# Patient Record
Sex: Male | Born: 1980 | Race: White | Hispanic: No | Marital: Single | Smoking: Current some day smoker
Health system: Southern US, Community
[De-identification: ages and names within clinical notes are randomized; demographics above are authoritative.]

## PROBLEM LIST (undated history)

## (undated) DIAGNOSIS — E291 Testicular hypofunction: Secondary | ICD-10-CM

## (undated) DIAGNOSIS — B009 Herpesviral infection, unspecified: Secondary | ICD-10-CM

## (undated) DIAGNOSIS — J36 Peritonsillar abscess: Secondary | ICD-10-CM

## (undated) DIAGNOSIS — E559 Vitamin D deficiency, unspecified: Secondary | ICD-10-CM

## (undated) DIAGNOSIS — F419 Anxiety disorder, unspecified: Secondary | ICD-10-CM

## (undated) HISTORY — DX: Herpesviral infection, unspecified: B00.9

## (undated) HISTORY — DX: Vitamin D deficiency, unspecified: E55.9

## (undated) HISTORY — DX: Peritonsillar abscess: J36

## (undated) HISTORY — DX: Testicular hypofunction: E29.1

## (undated) HISTORY — DX: Anxiety disorder, unspecified: F41.9

---

## 2010-05-17 ENCOUNTER — Encounter: Admission: RE | Admit: 2010-05-17 | Discharge: 2010-05-17 | Payer: Self-pay | Admitting: Internal Medicine

## 2010-07-15 ENCOUNTER — Emergency Department (HOSPITAL_COMMUNITY): Admission: EM | Admit: 2010-07-15 | Discharge: 2010-07-15 | Payer: Self-pay | Admitting: Emergency Medicine

## 2010-09-10 ENCOUNTER — Emergency Department (HOSPITAL_COMMUNITY)
Admission: EM | Admit: 2010-09-10 | Discharge: 2010-09-10 | Payer: Self-pay | Source: Home / Self Care | Admitting: Emergency Medicine
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
## 2012-08-29 DIAGNOSIS — B009 Herpesviral infection, unspecified: Secondary | ICD-10-CM

## 2012-08-29 HISTORY — DX: Herpesviral infection, unspecified: B00.9

## 2013-08-13 ENCOUNTER — Encounter: Payer: Self-pay | Admitting: Physician Assistant

## 2013-08-15 ENCOUNTER — Ambulatory Visit: Payer: Self-pay | Admitting: Physician Assistant

## 2013-09-10 ENCOUNTER — Encounter: Payer: Self-pay | Admitting: Physician Assistant

## 2013-09-10 ENCOUNTER — Ambulatory Visit (INDEPENDENT_AMBULATORY_CARE_PROVIDER_SITE_OTHER): Payer: BC Managed Care – PPO | Admitting: Physician Assistant

## 2013-09-10 VITALS — BP 132/80 | HR 88 | Temp 98.4°F | Resp 16 | Ht 71.0 in | Wt 187.0 lb

## 2013-09-10 DIAGNOSIS — Z113 Encounter for screening for infections with a predominantly sexual mode of transmission: Secondary | ICD-10-CM

## 2013-09-10 DIAGNOSIS — R300 Dysuria: Secondary | ICD-10-CM

## 2013-09-10 DIAGNOSIS — E0590 Thyrotoxicosis, unspecified without thyrotoxic crisis or storm: Secondary | ICD-10-CM

## 2013-09-10 DIAGNOSIS — I10 Essential (primary) hypertension: Secondary | ICD-10-CM

## 2013-09-10 DIAGNOSIS — F17200 Nicotine dependence, unspecified, uncomplicated: Secondary | ICD-10-CM

## 2013-09-10 DIAGNOSIS — R7402 Elevation of levels of lactic acid dehydrogenase (LDH): Secondary | ICD-10-CM

## 2013-09-10 DIAGNOSIS — R740 Nonspecific elevation of levels of transaminase and lactic acid dehydrogenase [LDH]: Secondary | ICD-10-CM

## 2013-09-10 DIAGNOSIS — N3000 Acute cystitis without hematuria: Secondary | ICD-10-CM

## 2013-09-10 LAB — BASIC METABOLIC PANEL WITH GFR
BUN: 12 mg/dL (ref 6–23)
CALCIUM: 9.5 mg/dL (ref 8.4–10.5)
CO2: 29 meq/L (ref 19–32)
Chloride: 103 mEq/L (ref 96–112)
Creat: 0.7 mg/dL (ref 0.50–1.35)
GFR, Est African American: >89 mL/min
GFR, Est Non African American: >89 mL/min
GLUCOSE: 104 mg/dL — AB (ref 70–99)
Potassium: 4.3 mEq/L (ref 3.5–5.3)
SODIUM: 138 meq/L (ref 135–145)

## 2013-09-10 LAB — CBC WITH DIFFERENTIAL/PLATELET
BASOS PCT: 0 % (ref 0–1)
Basophils Absolute: 0 10*3/uL (ref 0.0–0.1)
EOS ABS: 0 10*3/uL (ref 0.0–0.7)
Eosinophils Relative: 0 % (ref 0–5)
HEMATOCRIT: 43.4 % (ref 39.0–52.0)
HEMOGLOBIN: 14.9 g/dL (ref 13.0–17.0)
LYMPHS ABS: 1.4 10*3/uL (ref 0.7–4.0)
Lymphocytes Relative: 23 % (ref 12–46)
MCH: 29.5 pg (ref 26.0–34.0)
MCHC: 34.3 g/dL (ref 30.0–36.0)
MCV: 85.9 fL (ref 78.0–100.0)
MONO ABS: 0.4 10*3/uL (ref 0.1–1.0)
MONOS PCT: 6 % (ref 3–12)
NEUTROS PCT: 71 % (ref 43–77)
Neutro Abs: 4.5 10*3/uL (ref 1.7–7.7)
Platelets: 279 10*3/uL (ref 150–400)
RBC: 5.05 MIL/uL (ref 4.22–5.81)
RDW: 13.2 % (ref 11.5–15.5)
WBC: 6.3 10*3/uL (ref 4.0–10.5)

## 2013-09-10 LAB — HEPATIC FUNCTION PANEL
ALT: 18 U/L (ref 0–53)
AST: 15 U/L (ref 0–37)
Albumin: 4.6 g/dL (ref 3.5–5.2)
Alkaline Phosphatase: 52 U/L (ref 39–117)
BILIRUBIN DIRECT: 0.1 mg/dL (ref 0.0–0.3)
BILIRUBIN INDIRECT: 0.3 mg/dL (ref 0.0–0.9)
TOTAL PROTEIN: 7.3 g/dL (ref 6.0–8.3)
Total Bilirubin: 0.4 mg/dL (ref 0.3–1.2)

## 2013-09-10 MED ORDER — CIPROFLOXACIN HCL 500 MG PO TABS: 500.0000 mg | ORAL_TABLET | Freq: Two times a day (BID) | ORAL | Status: AC

## 2013-09-10 NOTE — Patient Instructions (Signed)
Genital Herpes °Genital herpes is a sexually transmitted disease. This means that it is a disease passed by having sex with an infected person. There is no cure for genital herpes. The time between attacks can be months to years. The virus may live in a person but produce no problems (symptoms). This infection can be passed to a baby as it travels down the birth canal (vagina). In a newborn, this can cause central nervous system damage, eye damage, or even death. The virus that causes genital herpes is usually HSV-2 virus. The virus that causes oral herpes is usually HSV-1. The diagnosis (learning what is wrong) is made through culture results. °SYMPTOMS  °Usually symptoms of pain and itching begin a few days to a week after contact. It first appears as small blisters that progress to small painful ulcers which then scab over and heal after several days. It affects the outer genitalia, birth canal, cervix, penis, anal area, buttocks, and thighs. °HOME CARE INSTRUCTIONS  °· Keep ulcerated areas dry and clean. °· Take medications as directed. Antiviral medications can speed up healing. They will not prevent recurrences or cure this infection. These medications can also be taken for suppression if there are frequent recurrences. °· While the infection is active, it is contagious. Avoid all sexual contact during active infections. °· Condoms may help prevent spread of the herpes virus. °· Practice safe sex. °· Wash your hands thoroughly after touching the genital area. °· Avoid touching your eyes after touching your genital area. °· Inform your caregiver if you have had genital herpes and become pregnant. It is your responsibility to insure a safe outcome for your baby in this pregnancy. °· Only take over-the-counter or prescription medicines for pain, discomfort, or fever as directed by your caregiver. °SEEK MEDICAL CARE IF:  °· You have a recurrence of this infection. °· You do not respond to medications and are not  improving. °· You have new sources of pain or discharge which have changed from the original infection. °· You have an oral temperature above 102° F (38.9° C). °· You develop abdominal pain. °· You develop eye pain or signs of eye infection. °Document Released: 08/12/2000 Document Revised: 11/07/2011 Document Reviewed: 09/02/2009 °[employer]® Patient Information ©2014 [employer]. °Safe Sex °Safe sex is about reducing the risk of giving or getting a sexually transmitted disease (STD). STDs are spread through sexual contact involving the genitals, mouth, or rectum. Some STDS can be cured and others cannot. Safe sex can also prevent unintended pregnancies.  °SAFE SEX PRACTICES °· Limit your sexual activity to only one partner who is only having sex with you. °· Talk to your partner about their past partners, past STDs, and drug use. °· Use a condom every time you have sexual intercourse. This includes vaginal, oral, and anal sexual activity. Both females and males should wear condoms during oral sex. Only use latex or polyurethane condoms and water-based lubricants. Petroleum-based lubricants or oils used to lubricate a condom will weaken the condom and increase the chance that it will break. The condom should be in place from the beginning to the end of sexual activity. Wearing a condom reduces, but does not completely eliminate, your risk of getting or giving a STD. STDs can be spread by contact with skin of surrounding areas. °· Get vaccinated for hepatitis B and HPV. °· Avoid alcohol and recreational drugs which can affect your judgement. You may forget to use a condom or participate in high-risk sex. °· For females,   avoid douching after sexual intercourse. Douching can spread an infection farther into the reproductive tract. °· Check your body for signs of sores, blisters, rashes, or unusual discharge. See your caregiver if you notice any of these signs. °· Avoid sexual contact if you have symptoms of an  infection or are being treated for an STD. If you or your partner has herpes, avoid sexual contact when blisters are present. Use condoms at all other times. °· See your caregiver for regular screenings, examinations, and tests for STDs. Before having sex with a new partner, each of you should be screened for STDs and talk about the results with your partner. °BENEFITS OF SAFE SEX  °· There is less of a chance of getting or giving an STD. °· You can prevent unwanted or unintended pregnancies. °· By discussing safer sex concerns with your partner, you may increase feelings of intimacy, comfort, trust, and honesty between the both of you. °Document Released: 09/22/2004 Document Revised: 05/09/2012 Document Reviewed: 02/06/2012 °[employer]® Patient Information ©2014 [employer]. ° °
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
## 2013-09-11 LAB — URINALYSIS, ROUTINE W REFLEX MICROSCOPIC
Bilirubin Urine: NEGATIVE
GLUCOSE, UA: NEGATIVE mg/dL
HGB URINE DIPSTICK: NEGATIVE
KETONES UR: NEGATIVE mg/dL
Leukocytes, UA: NEGATIVE
Nitrite: NEGATIVE
PH: 8 (ref 5.0–8.0)
PROTEIN: NEGATIVE mg/dL
Specific Gravity, Urine: 1.008 (ref 1.005–1.030)
Urobilinogen, UA: 0.2 mg/dL (ref 0.0–1.0)

## 2013-09-11 LAB — HSV(HERPES SIMPLEX VRS) I + II AB-IGG
HSV 1 GLYCOPROTEIN G AB, IGG: 0.28 IV
HSV 2 GLYCOPROTEIN G AB, IGG: 6.47 IV — AB

## 2013-09-11 LAB — URINE CULTURE
Colony Count: NO GROWTH
Organism ID, Bacteria: NO GROWTH

## 2013-09-11 LAB — GC/CHLAMYDIA PROBE AMP, URINE
CHLAMYDIA, SWAB/URINE, PCR: NEGATIVE
GC PROBE AMP, URINE: NEGATIVE

## 2013-09-11 LAB — TSH: TSH: 0.234 u[IU]/mL — AB (ref 0.350–4.500)

## 2013-09-11 LAB — HIV ANTIBODY (ROUTINE TESTING W REFLEX): HIV: NONREACTIVE

## 2013-09-12 ENCOUNTER — Other Ambulatory Visit: Payer: Self-pay | Admitting: Physician Assistant

## 2013-09-12 DIAGNOSIS — E0590 Thyrotoxicosis, unspecified without thyrotoxic crisis or storm: Secondary | ICD-10-CM

## 2013-09-12 NOTE — Addendum Note (Signed)
Addended by: Quentin MullingOLLIER, AMANDA R on: 09/12/2013 08:29 AM   Modules accepted: Orders

## 2013-10-09 ENCOUNTER — Encounter (HOSPITAL_COMMUNITY): Admission: RE | Admit: 2013-10-09 | Payer: BC Managed Care – PPO | Source: Ambulatory Visit

## 2013-10-10 ENCOUNTER — Encounter (HOSPITAL_COMMUNITY): Payer: BC Managed Care – PPO

## 2013-12-12 ENCOUNTER — Other Ambulatory Visit: Payer: Self-pay | Admitting: Physician Assistant

## 2014-02-27 ENCOUNTER — Encounter: Payer: Self-pay | Admitting: Physician Assistant

## 2014-03-03 ENCOUNTER — Encounter: Payer: Self-pay | Admitting: Internal Medicine
